# Patient Record
Sex: Male | Born: 2005 | Race: Black or African American | Hispanic: No | Marital: Single | Smoking: Never smoker
Health system: Southern US, Community
[De-identification: ages and names within clinical notes are randomized; demographics above are authoritative.]

## PROBLEM LIST (undated history)

## (undated) DIAGNOSIS — J45909 Unspecified asthma, uncomplicated: Secondary | ICD-10-CM

## (undated) HISTORY — PX: FINGER SURGERY: SHX640

## (undated) HISTORY — PX: MULTIPLE TOOTH EXTRACTIONS: SHX2053

---
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
## 2023-10-25 ENCOUNTER — Ambulatory Visit: Admitting: Allergy
